# Patient Record
Sex: Male | Born: 1975 | Race: White | Hispanic: No | Smoking: Current every day smoker
Health system: Southern US, Community
[De-identification: ages and names within clinical notes are randomized; demographics above are authoritative.]

## PROBLEM LIST (undated history)

## (undated) DIAGNOSIS — K8020 Calculus of gallbladder without cholecystitis without obstruction: Secondary | ICD-10-CM

## (undated) DIAGNOSIS — R51 Headache: Secondary | ICD-10-CM

## (undated) DIAGNOSIS — G4700 Insomnia, unspecified: Secondary | ICD-10-CM

## (undated) DIAGNOSIS — K219 Gastro-esophageal reflux disease without esophagitis: Secondary | ICD-10-CM

## (undated) HISTORY — PX: APPENDECTOMY: SHX54

---

## 2006-07-17 ENCOUNTER — Emergency Department (HOSPITAL_COMMUNITY): Admission: EM | Admit: 2006-07-17 | Discharge: 2006-07-17 | Payer: Self-pay | Admitting: Emergency Medicine

## 2009-01-12 ENCOUNTER — Emergency Department (HOSPITAL_COMMUNITY): Admission: EM | Admit: 2009-01-12 | Discharge: 2009-01-13 | Payer: Self-pay | Admitting: Emergency Medicine

## 2011-07-02 ENCOUNTER — Emergency Department (HOSPITAL_COMMUNITY): Payer: 59

## 2011-07-02 ENCOUNTER — Encounter (HOSPITAL_COMMUNITY): Payer: Self-pay | Admitting: Emergency Medicine

## 2011-07-02 ENCOUNTER — Emergency Department (HOSPITAL_COMMUNITY)
Admission: EM | Admit: 2011-07-02 | Discharge: 2011-07-02 | Disposition: A | Discharge status: Home / Self Care | Payer: 59 | Attending: Emergency Medicine | Admitting: Emergency Medicine

## 2011-07-02 DIAGNOSIS — G8929 Other chronic pain: Secondary | ICD-10-CM | POA: Insufficient documentation

## 2011-07-02 DIAGNOSIS — F17200 Nicotine dependence, unspecified, uncomplicated: Secondary | ICD-10-CM | POA: Insufficient documentation

## 2011-07-02 DIAGNOSIS — R079 Chest pain, unspecified: Secondary | ICD-10-CM | POA: Insufficient documentation

## 2011-07-02 DIAGNOSIS — M545 Low back pain, unspecified: Secondary | ICD-10-CM | POA: Insufficient documentation

## 2011-07-02 DIAGNOSIS — M549 Dorsalgia, unspecified: Secondary | ICD-10-CM | POA: Insufficient documentation

## 2011-07-02 DIAGNOSIS — Z79899 Other long term (current) drug therapy: Secondary | ICD-10-CM | POA: Insufficient documentation

## 2011-07-02 MED ORDER — HYDROCODONE-ACETAMINOPHEN 5-325 MG PO TABS: 2.0000 | ORAL_TABLET | ORAL | Status: AC | PRN

## 2011-07-02 MED ORDER — HYDROCODONE-ACETAMINOPHEN 5-325 MG PO TABS
2.0000 | ORAL_TABLET | Freq: Once | ORAL | Status: AC
  Administered 2011-07-02: 2 via ORAL
  Filled 2011-07-02: qty 2

## 2011-07-02 NOTE — Discharge Instructions (Signed)
Back Pain, Adult Low back pain is very common. About 1 in 5 people have back pain.The cause of low back pain is rarely dangerous. The pain often gets better over time.About half of people with a sudden onset of back pain feel better in just 2 weeks. About 8 in 10 people feel better by 6 weeks.  CAUSES Some common causes of back pain include:  Strain of the muscles or ligaments supporting the spine.   Wear and tear (degeneration) of the spinal discs.   Arthritis.   Direct injury to the back.  DIAGNOSIS Most of the time, the direct cause of low back pain is not known.However, back pain can be treated effectively even when the exact cause of the pain is unknown.Answering your caregiver's questions about your overall health and symptoms is one of the most accurate ways to make sure the cause of your pain is not dangerous. If your caregiver needs more information, he or she may order lab work or imaging tests (X-rays or MRIs).However, even if imaging tests show changes in your back, this usually does not require surgery. HOME CARE INSTRUCTIONS For many people, back pain returns.Since low back pain is rarely dangerous, it is often a condition that people can learn to manageon their own.   Remain active. It is stressful on the back to sit or stand in one place. Do not sit, drive, or stand in one place for more than 30 minutes at a time. Take short walks on level surfaces as soon as pain allows.Try to increase the length of time you walk each day.   Do not stay in bed.Resting more than 1 or 2 days can delay your recovery.   Do not avoid exercise or work.Your body is made to move.It is not dangerous to be active, even though your back may hurt.Your back will likely heal faster if you return to being active before your pain is gone.   Pay attention to your body when you bend and lift. Many people have less discomfortwhen lifting if they bend their knees, keep the load close to their  bodies,and avoid twisting. Often, the most comfortable positions are those that put less stress on your recovering back.   Find a comfortable position to sleep. Use a firm mattress and lie on your side with your knees slightly bent. If you lie on your back, put a pillow under your knees.   Only take over-the-counter or prescription medicines as directed by your caregiver. Over-the-counter medicines to reduce pain and inflammation are often the most helpful.Your caregiver may prescribe muscle relaxant drugs.These medicines help dull your pain so you can more quickly return to your normal activities and healthy exercise.   Put ice on the injured area.   Put ice in a plastic bag.   Place a towel between your skin and the bag.   Leave the ice on for 15 to 20 minutes, 3 to 4 times a day for the first 2 to 3 days. After that, ice and heat may be alternated to reduce pain and spasms.   Ask your caregiver about trying back exercises and gentle massage. This may be of some benefit.   Avoid feeling anxious or stressed.Stress increases muscle tension and can worsen back pain.It is important to recognize when you are anxious or stressed and learn ways to manage it.Exercise is a great option.  SEEK MEDICAL CARE IF:  You have pain that is not relieved with rest or medicine.   You have   pain that does not improve in 1 week.   You have new symptoms.   You are generally not feeling well.  SEEK IMMEDIATE MEDICAL CARE IF:   You have pain that radiates from your back into your legs.   You develop new bowel or bladder control problems.   You have unusual weakness or numbness in your arms or legs.   You develop nausea or vomiting.   You develop abdominal pain.   You feel faint.  Document Released: 04/28/2005 Document Revised: 01/08/2011 Document Reviewed: 09/16/2010 [facility] Patient Information 2012 [place], [place].  Call Dr. Modesto Charon today to let him noted that you were seen in the emergency  department. He may want to see you in the office Call [facility] today to get a primary care doctor

## 2011-07-02 NOTE — ED Notes (Signed)
Pt alert, nad, c/o mid back pain, onset  A few days ago, pt ambulates to room, steady gait noted, denies recent trauma or injury, seen previously with same c/o

## 2011-07-02 NOTE — ED Provider Notes (Addendum)
History     CSN: [id number]  Arrival date & time 07/02/11  0706   First MD Initiated Contact with Patient 07/02/11 [phone]      Chief Complaint  Patient presents with  . Back Pain    (Consider location/radiation/quality/duration/timing/severity/associated sxs/prior treatment) HPI Complains of upper back pain , pain is located immediately inferior to both scapulae onset last night no trauma no fever worse with movement or changing positions no shortness of breath no other complaint improved with remaining still pain is severe patient suffers from chronic back pain. Treated himself with hydrocodone-A. Pap this morning and ice with partial relief. Use his last 2 pain pills this morning. No other associated symptoms History reviewed. No pertinent past medical history. Chronic back pain Past Surgical History  Procedure Date  . Appendectomy     No family history on file.  History  Substance Use Topics  . Smoking status: Current Everyday Smoker -- 1.0 packs/day    Types: Cigarettes  . Smokeless tobacco: Not on file  . Alcohol Use: No      Review of Systems  Constitutional: Negative.   HENT: Negative.   Respiratory: Negative.   Cardiovascular: Negative.   Gastrointestinal: Negative.   Musculoskeletal: Positive for back pain.  Skin: Negative.   Neurological: Negative.   Hematological: Negative.   Psychiatric/Behavioral: Negative.     Allergies  Review of patient's allergies indicates no known allergies.  Home Medications   Current Outpatient Rx  Name Route Sig Dispense Refill  . HYDROCODONE-ACETAMINOPHEN 7.5-500 MG PO TABS Oral Take 1 tablet by mouth every 6 (six) hours as needed.      BP 116/65  Pulse 101  Temp(Src) 98.7 F (37.1 C) (Oral)  Resp 18  Wt 185 lb (83.915 kg)  SpO2 98%  Physical Exam  Nursing note and vitals reviewed. Constitutional: He appears well-developed and well-nourished. He appears distressed.       Appears uncomfortable  HENT:  Head:  Normocephalic and atraumatic.  Eyes: Conjunctivae are normal. Pupils are equal, round, and reactive to light.  Neck: Neck supple. No tracheal deviation present. No thyromegaly present.  Cardiovascular: Normal rate and regular rhythm.   No murmur heard. Pulmonary/Chest: Effort normal and breath sounds normal.  Abdominal: Soft. Bowel sounds are normal. He exhibits no distension. There is no tenderness.  Musculoskeletal: Normal range of motion. He exhibits no edema and no tenderness.       Entire spine nontender, pain is exacerbated when patient changes position in bed  Neurological: He is alert. Coordination normal.       Gait normal  Skin: Skin is warm and dry. No rash noted.  Psychiatric: He has a normal mood and affect.    ED Course  Procedures (including critical care time)  Labs Reviewed - No data to display No results found.   No diagnosis found. 9:30 PM pain improved after treatment with hydrocodone-A. Pap patient now reports pain is at lower back pain between scapulae has resolved   MDM  Pain felt to be musculoskeletal in etiology Plan prescription hydrocodone-A. Pap Followup Dr. Modesto Charon Patient encouraged to get a primary care Dr. Ardelle Lesches [facility] Diagnosis chronic back pain        Doug Sou, MD 07/02/11 5621  Doug Sou, MD 07/02/11 [phone]

## 2011-07-11 ENCOUNTER — Other Ambulatory Visit: Payer: Self-pay | Admitting: Orthopedic Surgery

## 2011-07-15 ENCOUNTER — Encounter (HOSPITAL_COMMUNITY): Payer: Self-pay | Admitting: Pharmacy Technician

## 2011-07-17 NOTE — Pre-Procedure Instructions (Signed)
21 Ian Burton  07/17/2011   Your procedure is scheduled on:  Thursday, March 14  Report to [facility] Short Stay Center at 10:00 AM.  Call this number if you have problems the morning of surgery: [phone]   Remember:   Do not eat food:After Midnight.  May have clear liquids: up to 4 Hours before arrival.  Clear liquids include soda, tea, black coffee, apple or grape juice, broth.  Take these medicines the morning of surgery with A SIP OF WATER: Hydrocodone   Do not wear jewelry, make-up or nail polish.  Do not wear lotions, powders, or perfumes. You may wear deodorant.  Do not shave 48 hours prior to surgery.  Do not bring valuables to the hospital.  Contacts, dentures or bridgework may not be worn into surgery.  Leave suitcase in the car. After surgery it may be brought to your room.  For patients admitted to the hospital, checkout time is 11:00 AM the day of discharge.   Patients discharged the day of surgery will not be allowed to drive home.  Name and phone number of your driver: Ian Burton [phone]  Special Instructions: Incentive Spirometry - Practice and bring it with you on the day of surgery. and CHG Shower Use Special Wash: 1/2 bottle night before surgery and 1/2 bottle morning of surgery.   Please read over the following fact sheets that you were given: Pain Booklet, Coughing and Deep Breathing, Blood Transfusion Information and Surgical Site Infection Prevention

## 2011-07-18 ENCOUNTER — Encounter (HOSPITAL_COMMUNITY): Payer: Self-pay

## 2011-07-18 ENCOUNTER — Other Ambulatory Visit: Payer: Self-pay

## 2011-07-18 ENCOUNTER — Encounter (HOSPITAL_COMMUNITY)
Admission: RE | Admit: 2011-07-18 | Discharge: 2011-07-18 | Disposition: A | Discharge status: Home / Self Care | Payer: 59 | Source: Ambulatory Visit | Attending: Orthopedic Surgery | Admitting: Orthopedic Surgery

## 2011-07-18 HISTORY — DX: Insomnia, unspecified: G47.00

## 2011-07-18 HISTORY — DX: Headache: R51

## 2011-07-18 HISTORY — DX: Gastro-esophageal reflux disease without esophagitis: K21.9

## 2011-07-18 HISTORY — DX: Calculus of gallbladder without cholecystitis without obstruction: K80.20

## 2011-07-18 LAB — URINALYSIS, ROUTINE W REFLEX MICROSCOPIC
Bilirubin Urine: NEGATIVE
Glucose, UA: NEGATIVE mg/dL
Hgb urine dipstick: NEGATIVE
Protein, ur: NEGATIVE mg/dL
Urobilinogen, UA: 0.2 mg/dL (ref 0.0–1.0)

## 2011-07-18 LAB — TYPE AND SCREEN
ABO/RH(D): A POS
Antibody Screen: NEGATIVE

## 2011-07-18 LAB — COMPREHENSIVE METABOLIC PANEL
BUN: 14 mg/dL (ref 6–23)
CO2: 26 mEq/L (ref 19–32)
Calcium: 9.8 mg/dL (ref 8.4–10.5)
Chloride: 106 mEq/L (ref 96–112)
Creatinine, Ser: 0.79 mg/dL (ref 0.50–1.35)
GFR calc Af Amer: >90 mL/min (ref >90)
GFR calc non Af Amer: >90 mL/min (ref >90)
Glucose, Bld: 97 mg/dL (ref 70–99)
Total Bilirubin: 0.4 mg/dL (ref 0.3–1.2)

## 2011-07-18 LAB — CBC
HCT: 43.1 % (ref 39.0–52.0)
Hemoglobin: 15.4 g/dL (ref 13.0–17.0)
MCH: 30.3 pg (ref 26.0–34.0)
MCV: 84.7 fL (ref 78.0–100.0)
Platelets: 280 10*3/uL (ref 150–400)
RBC: 5.09 MIL/uL (ref 4.22–5.81)
WBC: 7.5 10*3/uL (ref 4.0–10.5)

## 2011-07-18 LAB — SURGICAL PCR SCREEN
MRSA, PCR: NEGATIVE
Staphylococcus aureus: NEGATIVE

## 2011-07-18 LAB — DIFFERENTIAL
Basophils Absolute: 0 10*3/uL (ref 0.0–0.1)
Basophils Relative: 0 % (ref 0–1)
Lymphocytes Relative: 31 % (ref 12–46)
Monocytes Absolute: 0.5 10*3/uL (ref 0.1–1.0)
Neutro Abs: 4.6 10*3/uL (ref 1.7–7.7)
Neutrophils Relative %: 61 % (ref 43–77)

## 2011-07-18 LAB — PROTIME-INR: Prothrombin Time: 13.5 seconds (ref 11.6–15.2)

## 2011-07-21 NOTE — Consult Note (Signed)
Anesthesia:  Patient is a 36 year old male scheduled for a left L2-3 microdiskectomy on 07/24/11.  History includes smoking, insomnia, headaches, GERD, gallstones, and history of appendectomy.    Preoperative labs WNL.  CXR from 07/02/11 showed no acute process.  EKG from 07/18/11 showed SR with sinus arrhythmia, incomplete right BBB.  Currently there are no comparison EKGs.  No CV symptoms reported at PAT and there is no documented history of CAD/MI/CHF or DM.  If no new CV symptoms, then plan to proceed.

## 2011-07-23 MED ORDER — CEFAZOLIN SODIUM-DEXTROSE 2-3 GM-% IV SOLR
2.0000 g | INTRAVENOUS | Status: AC
  Administered 2011-07-24: 2 g via INTRAVENOUS
  Filled 2011-07-23: qty 50

## 2011-07-23 MED ORDER — POVIDONE-IODINE 7.5 % EX SOLN
Freq: Once | CUTANEOUS | Status: DC
  Filled 2011-07-23: qty 118

## 2011-07-23 NOTE — Progress Notes (Signed)
Pt informed of surgical time change and to arrive in short stay at 0900

## 2011-07-24 ENCOUNTER — Encounter (HOSPITAL_COMMUNITY): Payer: Self-pay | Admitting: Vascular Surgery

## 2011-07-24 ENCOUNTER — Ambulatory Visit (HOSPITAL_COMMUNITY): Payer: 59 | Admitting: Vascular Surgery

## 2011-07-24 ENCOUNTER — Ambulatory Visit (HOSPITAL_COMMUNITY)
Admission: RE | Admit: 2011-07-24 | Discharge: 2011-07-24 | Disposition: A | Discharge status: Home / Self Care | Payer: 59 | Source: Ambulatory Visit | Attending: Orthopedic Surgery | Admitting: Orthopedic Surgery

## 2011-07-24 ENCOUNTER — Ambulatory Visit (HOSPITAL_COMMUNITY): Payer: 59

## 2011-07-24 ENCOUNTER — Encounter (HOSPITAL_COMMUNITY)
Admission: RE | Disposition: A | Discharge status: Home / Self Care | Payer: Self-pay | Source: Ambulatory Visit | Attending: Orthopedic Surgery

## 2011-07-24 ENCOUNTER — Encounter (HOSPITAL_COMMUNITY): Payer: Self-pay | Admitting: *Deleted

## 2011-07-24 DIAGNOSIS — K8020 Calculus of gallbladder without cholecystitis without obstruction: Secondary | ICD-10-CM | POA: Insufficient documentation

## 2011-07-24 DIAGNOSIS — K219 Gastro-esophageal reflux disease without esophagitis: Secondary | ICD-10-CM | POA: Insufficient documentation

## 2011-07-24 DIAGNOSIS — R51 Headache: Secondary | ICD-10-CM | POA: Insufficient documentation

## 2011-07-24 DIAGNOSIS — M5410 Radiculopathy, site unspecified: Secondary | ICD-10-CM

## 2011-07-24 DIAGNOSIS — Z01810 Encounter for preprocedural cardiovascular examination: Secondary | ICD-10-CM | POA: Insufficient documentation

## 2011-07-24 DIAGNOSIS — Z01812 Encounter for preprocedural laboratory examination: Secondary | ICD-10-CM | POA: Insufficient documentation

## 2011-07-24 DIAGNOSIS — M5126 Other intervertebral disc displacement, lumbar region: Secondary | ICD-10-CM | POA: Insufficient documentation

## 2011-07-24 DIAGNOSIS — G4700 Insomnia, unspecified: Secondary | ICD-10-CM | POA: Insufficient documentation

## 2011-07-24 HISTORY — PX: LUMBAR LAMINECTOMY/DECOMPRESSION MICRODISCECTOMY: SHX5026

## 2011-07-24 SURGERY — LUMBAR LAMINECTOMY/DECOMPRESSION MICRODISCECTOMY
Anesthesia: General | Site: Back | Laterality: Left | Wound class: Clean

## 2011-07-24 MED ORDER — INDIGOTINDISULFONATE SODIUM 8 MG/ML IJ SOLN
INTRAMUSCULAR | Status: DC | PRN
  Administered 2011-07-24: 5 mL

## 2011-07-24 MED ORDER — ROCURONIUM BROMIDE 100 MG/10ML IV SOLN
INTRAVENOUS | Status: DC | PRN
  Administered 2011-07-24: 50 mg via INTRAVENOUS

## 2011-07-24 MED ORDER — FENTANYL CITRATE 0.05 MG/ML IJ SOLN
INTRAMUSCULAR | Status: DC | PRN
  Administered 2011-07-24 (×2): 50 ug via INTRAVENOUS
  Administered 2011-07-24: 200 ug via INTRAVENOUS
  Administered 2011-07-24: 100 ug via INTRAVENOUS
  Administered 2011-07-24: 50 ug via INTRAVENOUS

## 2011-07-24 MED ORDER — PROPOFOL 10 MG/ML IV EMUL
INTRAVENOUS | Status: DC | PRN
  Administered 2011-07-24: 200 mg via INTRAVENOUS

## 2011-07-24 MED ORDER — ACETAMINOPHEN 325 MG PO TABS: 325.0000 mg | ORAL_TABLET | ORAL | Status: DC | PRN

## 2011-07-24 MED ORDER — GLYCOPYRROLATE 0.2 MG/ML IJ SOLN
INTRAMUSCULAR | Status: DC | PRN
  Administered 2011-07-24: .8 mg via INTRAVENOUS

## 2011-07-24 MED ORDER — VECURONIUM BROMIDE 10 MG IV SOLR
INTRAVENOUS | Status: DC | PRN
  Administered 2011-07-24: 1 mg via INTRAVENOUS

## 2011-07-24 MED ORDER — FENTANYL CITRATE 0.05 MG/ML IJ SOLN
25.0000 ug | INTRAMUSCULAR | Status: DC | PRN
  Administered 2011-07-24 (×4): 25 ug via INTRAVENOUS

## 2011-07-24 MED ORDER — KETOROLAC TROMETHAMINE 30 MG/ML IJ SOLN
15.0000 mg | Freq: Once | INTRAMUSCULAR | Status: AC | PRN
  Administered 2011-07-24: 30 mg via INTRAVENOUS

## 2011-07-24 MED ORDER — THROMBIN 20000 UNITS EX KIT
PACK | CUTANEOUS | Status: DC | PRN
  Administered 2011-07-24: 12:00:00 via TOPICAL

## 2011-07-24 MED ORDER — MIDAZOLAM HCL 2 MG/2ML IJ SOLN: 0.5000 mg | Freq: Once | INTRAMUSCULAR | Status: DC | PRN

## 2011-07-24 MED ORDER — NEOSTIGMINE METHYLSULFATE 1 MG/ML IJ SOLN
INTRAMUSCULAR | Status: DC | PRN
  Administered 2011-07-24: 4 mg via INTRAVENOUS

## 2011-07-24 MED ORDER — PROMETHAZINE HCL 25 MG/ML IJ SOLN: 6.2500 mg | INTRAMUSCULAR | Status: DC | PRN

## 2011-07-24 MED ORDER — LACTATED RINGERS IV SOLN
INTRAVENOUS | Status: DC | PRN
  Administered 2011-07-24 (×2): via INTRAVENOUS

## 2011-07-24 MED ORDER — HETASTARCH-ELECTROLYTES 6 % IV SOLN
INTRAVENOUS | Status: DC | PRN
  Administered 2011-07-24: 11:00:00 via INTRAVENOUS

## 2011-07-24 MED ORDER — LIDOCAINE HCL (CARDIAC) 20 MG/ML IV SOLN
INTRAVENOUS | Status: DC | PRN
  Administered 2011-07-24: 50 mg via INTRAVENOUS

## 2011-07-24 MED ORDER — ONDANSETRON HCL 4 MG/2ML IJ SOLN
INTRAMUSCULAR | Status: DC | PRN
  Administered 2011-07-24: 4 mg via INTRAVENOUS

## 2011-07-24 MED ORDER — MEPERIDINE HCL 25 MG/ML IJ SOLN
6.2500 mg | INTRAMUSCULAR | Status: DC | PRN
Start: 2011-07-24 — End: 2011-07-24

## 2011-07-24 MED ORDER — BUPIVACAINE-EPINEPHRINE 0.25% -1:200000 IJ SOLN
INTRAMUSCULAR | Status: DC | PRN
  Administered 2011-07-24: 10 mL

## 2011-07-24 MED ORDER — MIDAZOLAM HCL 5 MG/5ML IJ SOLN
INTRAMUSCULAR | Status: DC | PRN
  Administered 2011-07-24: 2 mg via INTRAVENOUS

## 2011-07-24 MED ORDER — METHYLPREDNISOLONE ACETATE 40 MG/ML IJ SUSP
INTRAMUSCULAR | Status: DC | PRN
  Administered 2011-07-24: 40 mg via INTRA_ARTICULAR

## 2011-07-24 SURGICAL SUPPLY — 61 items
BENZOIN TINCTURE PRP APPL 2/3 (GAUZE/BANDAGES/DRESSINGS) ×2 IMPLANT
BUR ROUND PRECISION 4.0 (BURR) ×2 IMPLANT
CANISTER SUCTION 2500CC (MISCELLANEOUS) ×2 IMPLANT
CLOTH BEACON ORANGE TIMEOUT ST (SAFETY) ×2 IMPLANT
CONT SPEC STER OR (MISCELLANEOUS) ×2 IMPLANT
CORDS BIPOLAR (ELECTRODE) ×2 IMPLANT
COVER SURGICAL LIGHT HANDLE (MISCELLANEOUS) ×2 IMPLANT
DRAIN CHANNEL 10F 3/8 F FF (DRAIN) IMPLANT
DRAPE POUCH INSTRU U-SHP 10X18 (DRAPES) ×4 IMPLANT
DRAPE SURG 17X23 STRL (DRAPES) ×8 IMPLANT
DURAPREP 26ML APPLICATOR (WOUND CARE) ×2 IMPLANT
ELECT BLADE 4.0 EZ CLEAN MEGAD (MISCELLANEOUS)
ELECT BLADE 6.5 EXT (BLADE) ×2 IMPLANT
ELECT CAUTERY BLADE 6.4 (BLADE) ×2 IMPLANT
ELECT REM PT RETURN 9FT ADLT (ELECTROSURGICAL) ×2
ELECTRODE BLDE 4.0 EZ CLN MEGD (MISCELLANEOUS) IMPLANT
ELECTRODE REM PT RTRN 9FT ADLT (ELECTROSURGICAL) ×1 IMPLANT
EVACUATOR SILICONE 100CC (DRAIN) IMPLANT
FILTER STRAW FLUID ASPIR (MISCELLANEOUS) ×2 IMPLANT
GAUZE SPONGE 4X4 16PLY XRAY LF (GAUZE/BANDAGES/DRESSINGS) ×2 IMPLANT
GLOVE BIO SURGEON STRL SZ8 (GLOVE) ×4 IMPLANT
GLOVE BIOGEL PI IND STRL 8 (GLOVE) ×2 IMPLANT
GLOVE BIOGEL PI INDICATOR 8 (GLOVE) ×2
GOWN STRL NON-REIN LRG LVL3 (GOWN DISPOSABLE) ×4 IMPLANT
IV CATH 14GX2 1/4 (CATHETERS) ×2 IMPLANT
KIT BASIN OR (CUSTOM PROCEDURE TRAY) ×2 IMPLANT
KIT ROOM TURNOVER OR (KITS) ×2 IMPLANT
NEEDLE 18GX1X1/2 (RX/OR ONLY) (NEEDLE) ×2 IMPLANT
NEEDLE 22X1 1/2 (OR ONLY) (NEEDLE) ×2 IMPLANT
NEEDLE HYPO 25GX1X1/2 BEV (NEEDLE) ×2 IMPLANT
NEEDLE SPNL 18GX3.5 QUINCKE PK (NEEDLE) ×4 IMPLANT
NS IRRIG 1000ML POUR BTL (IV SOLUTION) ×2 IMPLANT
PACK LAMINECTOMY ORTHO (CUSTOM PROCEDURE TRAY) ×2 IMPLANT
PACK UNIVERSAL I (CUSTOM PROCEDURE TRAY) ×2 IMPLANT
PAD ARMBOARD 7.5X6 YLW CONV (MISCELLANEOUS) ×4 IMPLANT
PATTIES SURGICAL .5 X.5 (GAUZE/BANDAGES/DRESSINGS) IMPLANT
PATTIES SURGICAL .5 X1 (DISPOSABLE) ×2 IMPLANT
PATTIES SURGICAL 1X1 (DISPOSABLE) IMPLANT
SPONGE GAUZE 4X4 12PLY (GAUZE/BANDAGES/DRESSINGS) ×2 IMPLANT
SPONGE INTESTINAL PEANUT (DISPOSABLE) ×2 IMPLANT
SPONGE SURGIFOAM ABS GEL 100 (HEMOSTASIS) ×2 IMPLANT
STRIP CLOSURE SKIN 1/2X4 (GAUZE/BANDAGES/DRESSINGS) ×2 IMPLANT
SURGIFLO TRUKIT (HEMOSTASIS) ×2 IMPLANT
SUT ETHILON 3 0 FSL (SUTURE) IMPLANT
SUT MNCRL AB 4-0 PS2 18 (SUTURE) ×2 IMPLANT
SUT VIC AB 0 CT1 27 (SUTURE)
SUT VIC AB 0 CT1 27XBRD ANBCTR (SUTURE) IMPLANT
SUT VIC AB 0 CT2 27 (SUTURE) ×2 IMPLANT
SUT VIC AB 1 CT1 18XCR BRD 8 (SUTURE) ×1 IMPLANT
SUT VIC AB 1 CT1 8-18 (SUTURE) ×1
SUT VIC AB 2-0 CT2 18 VCP726D (SUTURE) ×2 IMPLANT
SYR 20CC LL (SYRINGE) IMPLANT
SYR BULB IRRIGATION 50ML (SYRINGE) ×2 IMPLANT
SYR CONTROL 10ML LL (SYRINGE) ×2 IMPLANT
SYR TB 1ML 26GX3/8 SAFETY (SYRINGE) ×4 IMPLANT
SYR TB 1ML LUER SLIP (SYRINGE) ×4 IMPLANT
TAPE CLOTH SURG 4X10 WHT LF (GAUZE/BANDAGES/DRESSINGS) ×2 IMPLANT
TOWEL OR 17X24 6PK STRL BLUE (TOWEL DISPOSABLE) ×2 IMPLANT
TOWEL OR 17X26 10 PK STRL BLUE (TOWEL DISPOSABLE) ×2 IMPLANT
WATER STERILE IRR 1000ML POUR (IV SOLUTION) IMPLANT
YANKAUER SUCT BULB TIP NO VENT (SUCTIONS) ×2 IMPLANT

## 2011-07-24 NOTE — Anesthesia Preprocedure Evaluation (Signed)
Anesthesia Evaluation  Patient identified by MRN, date of birth, ID band Patient awake    Reviewed: Allergy & Precautions, H&P , NPO status , Patient's Chart, lab work & pertinent test results, reviewed documented beta blocker date and time   Airway Mallampati: I      Dental  (+) Teeth Intact and Dental Advisory Given   Pulmonary    Pulmonary exam normal       Cardiovascular     Neuro/Psych  Headaches,    GI/Hepatic GERD-  Controlled,  Endo/Other    Renal/GU      Musculoskeletal   Abdominal Normal abdominal exam  (+)   Peds  Hematology   Anesthesia Other Findings   Reproductive/Obstetrics                           Anesthesia Physical Anesthesia Plan  ASA: I  Anesthesia Plan: General   Post-op Pain Management:    Induction: Intravenous  Airway Management Planned: Oral ETT  Additional Equipment:   Intra-op Plan:   Post-operative Plan: Extubation in OR  Informed Consent:   Dental advisory given  Plan Discussed with: Anesthesiologist  Anesthesia Plan Comments:         Anesthesia Quick Evaluation

## 2011-07-24 NOTE — Transfer of Care (Signed)
Immediate Anesthesia Transfer of Care Note  Patient: Ian Burton  Procedure(s) Performed: Procedure(s) (LRB): LUMBAR LAMINECTOMY/DECOMPRESSION MICRODISCECTOMY (Left)  Patient Location: PACU  Anesthesia Type: General  Level of Consciousness: awake, alert  and oriented  Airway & Oxygen Therapy: Patient Spontanous Breathing and Patient connected to nasal cannula oxygen  Post-op Assessment: Report given to PACU RN and Post -op Vital signs reviewed and stable  Post vital signs: Reviewed  Complications: No apparent anesthesia complications

## 2011-07-24 NOTE — Anesthesia Procedure Notes (Signed)
Procedure Name: Intubation Date/Time: 07/24/2011 10:41 AM Performed by: Mancel Parsons Pre-anesthesia Checklist: Patient identified, Timeout performed, Emergency Drugs available, Suction available and Patient being monitored Oxygen Delivery Method: Circle system utilized Preoxygenation: Pre-oxygenation with 100% oxygen Intubation Type: IV induction Ventilation: Mask ventilation without difficulty and Oral airway inserted - appropriate to patient size Laryngoscope Size: Mac and 3 Grade View: Grade II Tube type: Oral Tube size: 8.0 mm Number of attempts: 1 Airway Equipment and Method: Stylet Placement Confirmation: ETT inserted through vocal cords under direct vision,  positive ETCO2 and breath sounds checked- equal and bilateral Secured at: 22 cm Tube secured with: Tape Dental Injury: Teeth and Oropharynx as per pre-operative assessment

## 2011-07-24 NOTE — H&P (Signed)
PREOPERATIVE H&P  Chief Complaint: leg pain  HPI: Ian Burton is a 36 y.o. male who presents with leg pain  Past Medical History  Diagnosis Date  . Insomnia   . Gallstones     hx of, never removed  . Headache     migraines  . GERD (gastroesophageal reflux disease)    Past Surgical History  Procedure Date  . Appendectomy    History   Social History  . Marital Status: Married    Spouse Name: N/A    Number of Children: N/A  . Years of Education: N/A   Social History Main Topics  . Smoking status: Current Everyday Smoker -- 1.0 packs/day for 15 years    Types: Cigarettes  . Smokeless tobacco: Not on file  . Alcohol Use: No  . Drug Use: No  . Sexually Active: Yes   Other Topics Concern  . Not on file   Social History Narrative  . No narrative on file   No family history on file. No Known Allergies Prior to Admission medications   Medication Sig Start Date End Date Taking? Authorizing Provider  HYDROcodone-acetaminophen (NORCO) 7.5-325 MG per tablet Take 1 tablet by mouth every 8 (eight) hours as needed. For pain   Yes Historical Provider, MD     All other systems have been reviewed and were otherwise negative with the exception of those mentioned in the HPI and as above.  Physical Exam: There were no vitals filed for this visit.  General: Alert, no acute distress Cardiovascular: No pedal edema Respiratory: No cyanosis, no use of accessory musculature GI: No organomegaly, abdomen is soft and non-tender Skin: No lesions in the area of chief complaint Neurologic: Sensation intact distally Psychiatric: Patient is competent for consent with normal mood and affect Lymphatic: No axillary or cervical lymphadenopathy  MUSCULOSKELETAL: + TTP low back  Assessment/Plan: Leg pain Plan for Procedure(s): LUMBAR LAMINECTOMY/DECOMPRESSION MICRODISCECTOMY   Emilee Hero, MD 07/24/2011 6:11 AM

## 2011-07-24 NOTE — Preoperative (Signed)
Beta Blockers   Reason not to administer Beta Blockers:Not Applicable. No home beta blockers 

## 2011-07-24 NOTE — Anesthesia Postprocedure Evaluation (Signed)
Anesthesia Post Note  Patient: Ian Burton  Procedure(s) Performed: Procedure(s) (LRB): LUMBAR LAMINECTOMY/DECOMPRESSION MICRODISCECTOMY (Left)  Anesthesia type: GA  Patient location: PACU  Post pain: Pain level controlled  Post assessment: Post-op Vital signs reviewed  Last Vitals:  Filed Vitals:   07/24/11 0840  BP: 116/75  Pulse: 73  Temp: 36.8 C  Resp: 18    Post vital signs: Reviewed  Level of consciousness: sedated  Complications: No apparent anesthesia complications

## 2011-07-25 MED FILL — Ketorolac Tromethamine Inj 30 MG/ML: INTRAMUSCULAR | Qty: 1 | Status: AC

## 2011-07-25 NOTE — Op Note (Signed)
NAME:  Burton, MACLAREN NO.:  [id number]  MEDICAL RECORD NO.:  [id number]  LOCATION:  [facility]                         FACILITY:  [facility]  PHYSICIAN:  Estill Bamberg, MD      DATE OF BIRTH:  07-Feb-1976  DATE OF PROCEDURE:  07/24/2011 DATE OF DISCHARGE:  07/24/2011                              OPERATIVE REPORT   PREOPERATIVE DIAGNOSIS:  Left-sided L3 radiculopathy secondary to large extruded left-sided L2-3 disk fragment.  POSTOPERATIVE DIAGNOSIS:  Left-sided L3 radiculopathy secondary to large extruded left-sided L2-3 disk fragment.  PROCEDURES:  Left-sided L2-3 foraminotomy and partial facetectomy with removal of large extruded L2-3 disk fragment.  SURGEON:  Estill Bamberg, MD  ASSISTANT:  Janace Litten, OPA.  ANESTHESIA:  General endotracheal anesthesia.  COMPLICATIONS:  None.  DISPOSITION:  Stable.  ESTIMATED BLOOD LOSS:  Minimal.  INDICATIONS FOR PROCEDURE:  Briefly, Mr. Ian Burton is a very pleasant 36- year-old male who initially presented to me with severe pain, numbness, and weakness in the left leg.  An MRI was clearly notable for a large extruded L2-3 disk herniation with a caudad migration behind the L3 vertebral body.  Given the patient's ongoing weakness and failure of conservative care, we did have a discussion regarding going forward with removal of the disk fragment.  The patient fully understood the risks and limitations of the procedure as outlined in my preoperative  note.  OPERATIVE DETAILS:  On July 24, 2011, the patient brought to the surgery and general endotracheal anesthesia was administered.  The patient was placed prone on a well-padded Jackson bed with a Wilson frame.  Antibiotics were given, and SCDs were placed.  Time-out procedure was performed.  I then obtained a single lateral intraoperative radiograph with two 18-gauge spinal needles over both the L2 and the L3 spinous processes.  Indigo carmine was injected in the needle over  the L2 spinous process which did help confirm the appropriate operative level.  I then made a 1-inch incision centered over the L2-3 disk space.  A curvilinear incision was made in the fascia on the left side and the paraspinal musculature was bluntly swept laterally.  A self-retaining McCulloch retractor was then placed.  I then used a 4-mm high-speed bur to perform a laminotomy at the inferior medial aspect of the L2 lamina.  The ligamentum flavum was readily identified and removed meticulously.  The traversing L3 nerve was readily identified and clearly noted to be under severe for compression. I did gently medialize the nerve, and there was a large L2-3 disk fragment which was clearly notable to be located behind the L3 vertebral body and at the level of the L2-3 disk.  I did use a Penfield 4 to tease away all the adhesions surrounding the disk, and I was ultimately able to free up the disk herniation and removed 1 large disk fragment which did include both the intervertebral area and the area behind the L3 vertebral body.  At this point, I did use FloSeal for approximately 5 minutes and all epidural bleeding was controlled in this manner.  The wound was then copiously irrigated using 500 mL of normal saline.  The fascia was then closed using 1  Vicryl and the subcutaneous layer was closed using 2-0 Vicryl and the skin was closed using 4-0 Monocryl.  All instrument counts were correct at the termination of the procedure.  The patient was then awakened from general endotracheal anesthesia and transferred to recovery in stable condition.  Of note, Janace Litten, was my assistant throughout the procedure and aided in essential retraction and suctioning required throughout the surgery.     Estill Bamberg, MD     MD/MEDQ  D:  07/24/2011  T:  07/25/2011  Job:  [id number]

## 2011-07-29 ENCOUNTER — Encounter (HOSPITAL_COMMUNITY): Payer: Self-pay | Admitting: Orthopedic Surgery

## 2012-01-16 ENCOUNTER — Ambulatory Visit
Admission: RE | Admit: 2012-01-16 | Discharge: 2012-01-16 | Disposition: A | Discharge status: Home / Self Care | Payer: 59 | Source: Ambulatory Visit | Attending: Orthopedic Surgery | Admitting: Orthopedic Surgery

## 2012-01-16 ENCOUNTER — Other Ambulatory Visit: Payer: Self-pay | Admitting: Orthopedic Surgery

## 2012-01-16 DIAGNOSIS — M542 Cervicalgia: Secondary | ICD-10-CM

## 2013-11-21 ENCOUNTER — Other Ambulatory Visit: Payer: Self-pay | Admitting: Orthopedic Surgery

## 2013-11-23 ENCOUNTER — Encounter (HOSPITAL_COMMUNITY): Payer: Self-pay | Admitting: Pharmacy Technician

## 2013-11-25 ENCOUNTER — Inpatient Hospital Stay (HOSPITAL_COMMUNITY)
Admission: RE | Admit: 2013-11-25 | Discharge: 2013-11-25 | Disposition: A | Discharge status: Home / Self Care | Payer: 59 | Source: Ambulatory Visit

## 2013-11-25 NOTE — Pre-Procedure Instructions (Signed)
Ian PeonWilliam T Burton  11/25/2013   Your procedure is scheduled on:  Thursday, July 23.  [facility].  Call this number if you have problems the morning of surgery: [phone]   Remember:   Do not eat food or drink liquids after midnight Wednesday, July 22.  Take these medicines the morning of surgery with A SIP OF WATER: take if needed HYDROcodone-acetaminophen (NORCO).    Do not wear jewelry, make-up or nail polish.  Do not wear lotions, powders, or perfumes.              Men may shave face and neck.  Do not bring valuables to the hospital.            [facility] not [facility] any belongings or valuables.               Contacts, dentures or bridgework may not be worn into surgery.  Leave suitcase in the car. After surgery it may be brought to your room.  For patients admitted to the hospital, discharge time is determined by your treatment team.               Patients discharged the day of surgery will not be allowed to drive home.  Name and phone number of your driver: -   Special Instructions: -   Please read over the following fact sheets that you were given: Pain Booklet, Coughing and Deep Breathing, Blood Transfusion Information and Surgical Site Infection Prevention And Incentive Spirometery

## 2013-11-29 ENCOUNTER — Encounter (HOSPITAL_COMMUNITY): Payer: Self-pay | Admitting: *Deleted

## 2013-11-29 MED ORDER — CEFAZOLIN SODIUM-DEXTROSE 2-3 GM-% IV SOLR
2.0000 g | INTRAVENOUS | Status: AC
  Administered 2013-11-30: 2 g via INTRAVENOUS
  Filled 2013-11-29: qty 50

## 2013-11-29 NOTE — Progress Notes (Signed)
Pt denies SOB, chest pain, and being under the care of a cardiologist. Pt denies having a chest x ray and EKG within the last year. Pt denies having a stress test, echo, and cardiac cath. Pt advised to stop taking Aspirin,vitamins, and herbal medications. Do not take any NSAIDs ie: Ibuprofen, Advil, Naproxen or any medication containing Aspirin.

## 2013-11-30 ENCOUNTER — Encounter (HOSPITAL_COMMUNITY)
Admission: RE | Disposition: A | Discharge status: Home / Self Care | Payer: Self-pay | Source: Ambulatory Visit | Attending: Orthopedic Surgery

## 2013-11-30 ENCOUNTER — Encounter (HOSPITAL_COMMUNITY): Payer: 59 | Admitting: Anesthesiology

## 2013-11-30 ENCOUNTER — Ambulatory Visit (HOSPITAL_COMMUNITY): Payer: 59

## 2013-11-30 ENCOUNTER — Observation Stay (HOSPITAL_COMMUNITY)
Admission: RE | Admit: 2013-11-30 | Discharge: 2013-12-01 | Disposition: A | Discharge status: Home / Self Care | Payer: 59 | Source: Ambulatory Visit | Attending: Orthopedic Surgery | Admitting: Orthopedic Surgery

## 2013-11-30 ENCOUNTER — Encounter (HOSPITAL_COMMUNITY): Payer: Self-pay

## 2013-11-30 ENCOUNTER — Other Ambulatory Visit (HOSPITAL_COMMUNITY): Payer: 59

## 2013-11-30 ENCOUNTER — Ambulatory Visit (HOSPITAL_COMMUNITY): Payer: 59 | Admitting: Anesthesiology

## 2013-11-30 DIAGNOSIS — F1210 Cannabis abuse, uncomplicated: Secondary | ICD-10-CM | POA: Insufficient documentation

## 2013-11-30 DIAGNOSIS — M4802 Spinal stenosis, cervical region: Principal | ICD-10-CM | POA: Insufficient documentation

## 2013-11-30 DIAGNOSIS — M5410 Radiculopathy, site unspecified: Secondary | ICD-10-CM | POA: Diagnosis present

## 2013-11-30 DIAGNOSIS — R51 Headache: Secondary | ICD-10-CM | POA: Insufficient documentation

## 2013-11-30 DIAGNOSIS — F17200 Nicotine dependence, unspecified, uncomplicated: Secondary | ICD-10-CM | POA: Insufficient documentation

## 2013-11-30 DIAGNOSIS — M5412 Radiculopathy, cervical region: Secondary | ICD-10-CM | POA: Insufficient documentation

## 2013-11-30 DIAGNOSIS — K219 Gastro-esophageal reflux disease without esophagitis: Secondary | ICD-10-CM | POA: Insufficient documentation

## 2013-11-30 HISTORY — PX: ANTERIOR CERVICAL DECOMP/DISCECTOMY FUSION: SHX1161

## 2013-11-30 LAB — CBC WITH DIFFERENTIAL/PLATELET
BASOS ABS: 0 10*3/uL (ref 0.0–0.1)
BASOS PCT: 0 % (ref 0–1)
Eosinophils Absolute: 0 10*3/uL (ref 0.0–0.7)
Eosinophils Relative: 1 % (ref 0–5)
HCT: 45.5 % (ref 39.0–52.0)
Hemoglobin: 15.8 g/dL (ref 13.0–17.0)
Lymphocytes Relative: 13 % (ref 12–46)
Lymphs Abs: 1 10*3/uL (ref 0.7–4.0)
MCH: 30.3 pg (ref 26.0–34.0)
MCHC: 34.7 g/dL (ref 30.0–36.0)
MCV: 87.2 fL (ref 78.0–100.0)
Monocytes Absolute: 0.5 10*3/uL (ref 0.1–1.0)
Monocytes Relative: 7 % (ref 3–12)
NEUTROS ABS: 6.3 10*3/uL (ref 1.7–7.7)
NEUTROS PCT: 79 % — AB (ref 43–77)
PLATELETS: 204 10*3/uL (ref 150–400)
RBC: 5.22 MIL/uL (ref 4.22–5.81)
RDW: 12.4 % (ref 11.5–15.5)
WBC: 7.9 10*3/uL (ref 4.0–10.5)

## 2013-11-30 LAB — COMPREHENSIVE METABOLIC PANEL
ALBUMIN: 4 g/dL (ref 3.5–5.2)
ALT: 13 U/L (ref 0–53)
AST: 13 U/L (ref 0–37)
Alkaline Phosphatase: 56 U/L (ref 39–117)
Anion gap: 16 — ABNORMAL HIGH (ref 5–15)
BILIRUBIN TOTAL: 0.5 mg/dL (ref 0.3–1.2)
BUN: 14 mg/dL (ref 6–23)
CHLORIDE: 97 meq/L (ref 96–112)
CO2: 24 mEq/L (ref 19–32)
Calcium: 9.4 mg/dL (ref 8.4–10.5)
Creatinine, Ser: 0.88 mg/dL (ref 0.50–1.35)
GFR calc Af Amer: >90 mL/min (ref >90)
GFR calc non Af Amer: >90 mL/min (ref >90)
Glucose, Bld: 118 mg/dL — ABNORMAL HIGH (ref 70–99)
Potassium: 3.6 mEq/L — ABNORMAL LOW (ref 3.7–5.3)
SODIUM: 137 meq/L (ref 137–147)
Total Protein: 7.4 g/dL (ref 6.0–8.3)

## 2013-11-30 LAB — TYPE AND SCREEN
ABO/RH(D): A POS
ANTIBODY SCREEN: NEGATIVE

## 2013-11-30 LAB — SURGICAL PCR SCREEN
MRSA, PCR: NEGATIVE
STAPHYLOCOCCUS AUREUS: NEGATIVE

## 2013-11-30 LAB — PROTIME-INR
INR: 1 (ref 0.00–1.49)
PROTHROMBIN TIME: 13.2 s (ref 11.6–15.2)

## 2013-11-30 LAB — APTT: APTT: 31 s (ref 24–37)

## 2013-11-30 SURGERY — ANTERIOR CERVICAL DECOMPRESSION/DISCECTOMY FUSION 2 LEVELS
Anesthesia: General | Site: Spine Cervical

## 2013-11-30 MED ORDER — FENTANYL CITRATE 0.05 MG/ML IJ SOLN
INTRAMUSCULAR | Status: AC
  Filled 2013-11-30: qty 5

## 2013-11-30 MED ORDER — ROCURONIUM BROMIDE 50 MG/5ML IV SOLN
INTRAVENOUS | Status: AC
  Filled 2013-11-30: qty 1

## 2013-11-30 MED ORDER — FENTANYL CITRATE 0.05 MG/ML IJ SOLN
INTRAMUSCULAR | Status: DC | PRN
  Administered 2013-11-30 (×4): 50 ug via INTRAVENOUS
  Administered 2013-11-30 (×3): 100 ug via INTRAVENOUS
  Administered 2013-11-30: 50 ug via INTRAVENOUS

## 2013-11-30 MED ORDER — DIAZEPAM 5 MG PO TABS
ORAL_TABLET | ORAL | Status: AC
  Administered 2013-11-30: 5 mg via ORAL
  Filled 2013-11-30: qty 1

## 2013-11-30 MED ORDER — ONDANSETRON HCL 4 MG/2ML IJ SOLN
INTRAMUSCULAR | Status: AC
  Filled 2013-11-30: qty 2

## 2013-11-30 MED ORDER — ONDANSETRON HCL 4 MG/2ML IJ SOLN: 4.0000 mg | INTRAMUSCULAR | Status: DC | PRN

## 2013-11-30 MED ORDER — DOCUSATE SODIUM 100 MG PO CAPS
100.0000 mg | ORAL_CAPSULE | Freq: Two times a day (BID) | ORAL | Status: DC
  Filled 2013-11-30 (×3): qty 1

## 2013-11-30 MED ORDER — CEFAZOLIN SODIUM 1-5 GM-% IV SOLN
1.0000 g | Freq: Three times a day (TID) | INTRAVENOUS | Status: AC
  Administered 2013-11-30 – 2013-12-01 (×2): 1 g via INTRAVENOUS
  Filled 2013-11-30 (×2): qty 50

## 2013-11-30 MED ORDER — BISACODYL 5 MG PO TBEC
5.0000 mg | DELAYED_RELEASE_TABLET | Freq: Every day | ORAL | Status: DC | PRN
  Filled 2013-11-30: qty 1

## 2013-11-30 MED ORDER — LACTATED RINGERS IV SOLN
INTRAVENOUS | Status: DC
  Administered 2013-11-30: 13:00:00 via INTRAVENOUS

## 2013-11-30 MED ORDER — NEOSTIGMINE METHYLSULFATE 10 MG/10ML IV SOLN
INTRAVENOUS | Status: AC
  Filled 2013-11-30: qty 1

## 2013-11-30 MED ORDER — PHENOL 1.4 % MT LIQD: 1.0000 | OROMUCOSAL | Status: DC | PRN

## 2013-11-30 MED ORDER — POVIDONE-IODINE 7.5 % EX SOLN
Freq: Once | CUTANEOUS | Status: DC
  Filled 2013-11-30: qty 118

## 2013-11-30 MED ORDER — LACTATED RINGERS IV SOLN
INTRAVENOUS | Status: DC | PRN
  Administered 2013-11-30 (×3): via INTRAVENOUS

## 2013-11-30 MED ORDER — MUPIROCIN 2 % EX OINT
TOPICAL_OINTMENT | CUTANEOUS | Status: AC
  Filled 2013-11-30: qty 22

## 2013-11-30 MED ORDER — ALUM & MAG HYDROXIDE-SIMETH 200-200-20 MG/5ML PO SUSP: 30.0000 mL | Freq: Four times a day (QID) | ORAL | Status: DC | PRN

## 2013-11-30 MED ORDER — GLYCOPYRROLATE 0.2 MG/ML IJ SOLN
INTRAMUSCULAR | Status: AC
  Filled 2013-11-30: qty 1

## 2013-11-30 MED ORDER — GLYCOPYRROLATE 0.2 MG/ML IJ SOLN
INTRAMUSCULAR | Status: DC | PRN
  Administered 2013-11-30: 0.2 mg via INTRAVENOUS

## 2013-11-30 MED ORDER — HYDROMORPHONE HCL PF 1 MG/ML IJ SOLN
INTRAMUSCULAR | Status: AC
  Administered 2013-11-30: 0.5 mg via INTRAVENOUS
  Filled 2013-11-30: qty 1

## 2013-11-30 MED ORDER — LIDOCAINE HCL (CARDIAC) 20 MG/ML IV SOLN
INTRAVENOUS | Status: DC | PRN
  Administered 2013-11-30: 100 mg via INTRAVENOUS

## 2013-11-30 MED ORDER — PROPOFOL 10 MG/ML IV BOLUS
INTRAVENOUS | Status: DC | PRN
  Administered 2013-11-30: 200 mg via INTRAVENOUS

## 2013-11-30 MED ORDER — ONDANSETRON HCL 4 MG/2ML IJ SOLN: 4.0000 mg | Freq: Four times a day (QID) | INTRAMUSCULAR | Status: DC | PRN

## 2013-11-30 MED ORDER — MUPIROCIN 2 % EX OINT
1.0000 "application " | TOPICAL_OINTMENT | Freq: Once | CUTANEOUS | Status: AC
  Administered 2013-11-30: 1 via TOPICAL

## 2013-11-30 MED ORDER — THROMBIN 20000 UNITS EX SOLR
CUTANEOUS | Status: AC
  Filled 2013-11-30: qty 20000

## 2013-11-30 MED ORDER — PHENYLEPHRINE HCL 10 MG/ML IJ SOLN
INTRAMUSCULAR | Status: DC | PRN
  Administered 2013-11-30: 80 ug via INTRAVENOUS

## 2013-11-30 MED ORDER — THROMBIN 20000 UNITS EX KIT
PACK | CUTANEOUS | Status: AC
  Filled 2013-11-30: qty 1

## 2013-11-30 MED ORDER — MENTHOL 3 MG MT LOZG: 1.0000 | LOZENGE | OROMUCOSAL | Status: DC | PRN

## 2013-11-30 MED ORDER — HYDROMORPHONE HCL PF 1 MG/ML IJ SOLN
0.5000 mg | INTRAMUSCULAR | Status: DC | PRN
  Administered 2013-11-30 (×2): 0.5 mg via INTRAVENOUS

## 2013-11-30 MED ORDER — SENNOSIDES-DOCUSATE SODIUM 8.6-50 MG PO TABS
1.0000 | ORAL_TABLET | Freq: Every evening | ORAL | Status: DC | PRN
  Filled 2013-11-30: qty 1

## 2013-11-30 MED ORDER — BUPIVACAINE-EPINEPHRINE (PF) 0.25% -1:200000 IJ SOLN
INTRAMUSCULAR | Status: AC
  Filled 2013-11-30: qty 30

## 2013-11-30 MED ORDER — 0.9 % SODIUM CHLORIDE (POUR BTL) OPTIME
TOPICAL | Status: DC | PRN
  Administered 2013-11-30: 1000 mL

## 2013-11-30 MED ORDER — MIDAZOLAM HCL 2 MG/2ML IJ SOLN
INTRAMUSCULAR | Status: AC
  Filled 2013-11-30: qty 2

## 2013-11-30 MED ORDER — HYDROMORPHONE HCL PF 1 MG/ML IJ SOLN
0.2500 mg | INTRAMUSCULAR | Status: DC | PRN
  Administered 2013-11-30 (×4): 0.5 mg via INTRAVENOUS

## 2013-11-30 MED ORDER — FLEET ENEMA 7-19 GM/118ML RE ENEM
1.0000 | ENEMA | Freq: Once | RECTAL | Status: AC | PRN
  Filled 2013-11-30: qty 1

## 2013-11-30 MED ORDER — OXYCODONE HCL 5 MG PO TABS: 5.0000 mg | ORAL_TABLET | Freq: Once | ORAL | Status: DC | PRN

## 2013-11-30 MED ORDER — ACETAMINOPHEN 650 MG RE SUPP: 650.0000 mg | RECTAL | Status: DC | PRN

## 2013-11-30 MED ORDER — MIDAZOLAM HCL 2 MG/2ML IJ SOLN
INTRAMUSCULAR | Status: AC
  Administered 2013-11-30: 2 mg via INTRAVENOUS
  Filled 2013-11-30: qty 2

## 2013-11-30 MED ORDER — DIAZEPAM 5 MG PO TABS
5.0000 mg | ORAL_TABLET | Freq: Four times a day (QID) | ORAL | Status: DC | PRN
  Administered 2013-11-30 – 2013-12-01 (×2): 5 mg via ORAL
  Filled 2013-11-30: qty 1

## 2013-11-30 MED ORDER — ROCURONIUM BROMIDE 100 MG/10ML IV SOLN
INTRAVENOUS | Status: DC | PRN
  Administered 2013-11-30: 50 mg via INTRAVENOUS

## 2013-11-30 MED ORDER — THROMBIN 20000 UNITS EX SOLR
CUTANEOUS | Status: DC | PRN
  Administered 2013-11-30: 16:00:00 via TOPICAL

## 2013-11-30 MED ORDER — OXYCODONE-ACETAMINOPHEN 5-325 MG PO TABS
1.0000 | ORAL_TABLET | ORAL | Status: DC | PRN
  Administered 2013-11-30 – 2013-12-01 (×2): 2 via ORAL
  Filled 2013-11-30 (×2): qty 2

## 2013-11-30 MED ORDER — BUPIVACAINE-EPINEPHRINE 0.25% -1:200000 IJ SOLN
INTRAMUSCULAR | Status: DC | PRN
  Administered 2013-11-30: 3 mL

## 2013-11-30 MED ORDER — DEXAMETHASONE SODIUM PHOSPHATE 10 MG/ML IJ SOLN
INTRAMUSCULAR | Status: DC | PRN
  Administered 2013-11-30: 10 mg via INTRAVENOUS

## 2013-11-30 MED ORDER — SODIUM CHLORIDE 0.9 % IJ SOLN
3.0000 mL | INTRAMUSCULAR | Status: DC | PRN
Start: 2013-11-30 — End: 2013-12-01

## 2013-11-30 MED ORDER — LIDOCAINE HCL (CARDIAC) 20 MG/ML IV SOLN
INTRAVENOUS | Status: AC
  Filled 2013-11-30: qty 5

## 2013-11-30 MED ORDER — ACETAMINOPHEN 325 MG PO TABS: 650.0000 mg | ORAL_TABLET | ORAL | Status: DC | PRN

## 2013-11-30 MED ORDER — MIDAZOLAM HCL 2 MG/2ML IJ SOLN
2.0000 mg | Freq: Once | INTRAMUSCULAR | Status: AC
  Administered 2013-11-30: 2 mg via INTRAVENOUS

## 2013-11-30 MED ORDER — SODIUM CHLORIDE 0.9 % IV SOLN: 250.0000 mL | INTRAVENOUS | Status: DC

## 2013-11-30 MED ORDER — NEOSTIGMINE METHYLSULFATE 10 MG/10ML IV SOLN
INTRAVENOUS | Status: DC | PRN
  Administered 2013-11-30: 2 mg via INTRAVENOUS

## 2013-11-30 MED ORDER — GLYCOPYRROLATE 0.2 MG/ML IJ SOLN
INTRAMUSCULAR | Status: AC
  Filled 2013-11-30: qty 2

## 2013-11-30 MED ORDER — MORPHINE SULFATE 2 MG/ML IJ SOLN
1.0000 mg | INTRAMUSCULAR | Status: DC | PRN
  Administered 2013-11-30 – 2013-12-01 (×3): 4 mg via INTRAVENOUS
  Filled 2013-11-30 (×3): qty 2

## 2013-11-30 MED ORDER — SODIUM CHLORIDE 0.9 % IJ SOLN: 3.0000 mL | Freq: Two times a day (BID) | INTRAMUSCULAR | Status: DC

## 2013-11-30 MED ORDER — MIDAZOLAM HCL 5 MG/5ML IJ SOLN
INTRAMUSCULAR | Status: DC | PRN
  Administered 2013-11-30 (×2): 2 mg via INTRAVENOUS

## 2013-11-30 MED ORDER — PROPOFOL 10 MG/ML IV BOLUS
INTRAVENOUS | Status: AC
  Filled 2013-11-30: qty 20

## 2013-11-30 MED ORDER — OXYCODONE HCL 5 MG/5ML PO SOLN: 5.0000 mg | Freq: Once | ORAL | Status: DC | PRN

## 2013-11-30 SURGICAL SUPPLY — 76 items
BENZOIN TINCTURE PRP APPL 2/3 (GAUZE/BANDAGES/DRESSINGS) ×3 IMPLANT
BIT DRILL NEURO 2X3.1 SFT TUCH (MISCELLANEOUS) ×1 IMPLANT
BIT DRILL SRG 14X2.2XFLT CHK (BIT) ×1 IMPLANT
BIT DRL SRG 14X2.2XFLT CHK (BIT) ×1
BLADE SURG 15 STRL LF DISP TIS (BLADE) ×1 IMPLANT
BLADE SURG 15 STRL SS (BLADE) ×2
BLADE SURG ROTATE 9660 (MISCELLANEOUS) ×3 IMPLANT
BUR MATCHSTICK NEURO 3.0 LAGG (BURR) IMPLANT
CARTRIDGE OIL MAESTRO DRILL (MISCELLANEOUS) ×1 IMPLANT
CERVICAL PARALLEL MED 7MM (Bone Implant) ×3 IMPLANT
CLOSURE STERI-STRIP 1/2X4 (GAUZE/BANDAGES/DRESSINGS) ×1
CLOSURE WOUND 1/2 X4 (GAUZE/BANDAGES/DRESSINGS) ×1
CLSR STERI-STRIP ANTIMIC 1/2X4 (GAUZE/BANDAGES/DRESSINGS) ×2 IMPLANT
COLLAR CERV LO CONTOUR FIRM DE (SOFTGOODS) ×3 IMPLANT
CORDS BIPOLAR (ELECTRODE) ×3 IMPLANT
COVER SURGICAL LIGHT HANDLE (MISCELLANEOUS) ×3 IMPLANT
CRADLE DONUT ADULT HEAD (MISCELLANEOUS) ×3 IMPLANT
DEVICE ENDSKLTN TCERV VBR SM 6 (Orthopedic Implant) ×1 IMPLANT
DIFFUSER DRILL AIR PNEUMATIC (MISCELLANEOUS) ×3 IMPLANT
DRAIN JACKSON RD 7FR 3/32 (WOUND CARE) IMPLANT
DRAPE C-ARM 42X72 X-RAY (DRAPES) ×3 IMPLANT
DRAPE POUCH INSTRU U-SHP 10X18 (DRAPES) ×3 IMPLANT
DRAPE SURG 17X23 STRL (DRAPES) ×9 IMPLANT
DRILL BIT SKYLINE 14MM (BIT) ×2
DRILL NEURO 2X3.1 SOFT TOUCH (MISCELLANEOUS) ×3
DURAPREP 26ML APPLICATOR (WOUND CARE) ×3 IMPLANT
ELECT COATED BLADE 2.86 ST (ELECTRODE) ×3 IMPLANT
ELECT REM PT RETURN 9FT ADLT (ELECTROSURGICAL) ×3
ELECTRODE REM PT RTRN 9FT ADLT (ELECTROSURGICAL) ×1 IMPLANT
ENDOSKELETON T CERV VBR SM 6MM (Orthopedic Implant) ×3 IMPLANT
EVACUATOR SILICONE 100CC (DRAIN) IMPLANT
GAUZE SPONGE 4X4 16PLY XRAY LF (GAUZE/BANDAGES/DRESSINGS) ×3 IMPLANT
GLOVE BIO SURGEON STRL SZ7 (GLOVE) ×3 IMPLANT
GLOVE BIO SURGEON STRL SZ8 (GLOVE) ×3 IMPLANT
GLOVE BIOGEL PI IND STRL 7.5 (GLOVE) ×2 IMPLANT
GLOVE BIOGEL PI IND STRL 8 (GLOVE) ×1 IMPLANT
GLOVE BIOGEL PI INDICATOR 7.5 (GLOVE) ×4
GLOVE BIOGEL PI INDICATOR 8 (GLOVE) ×2
GOWN STRL REUS W/ TWL LRG LVL3 (GOWN DISPOSABLE) ×1 IMPLANT
GOWN STRL REUS W/ TWL XL LVL3 (GOWN DISPOSABLE) ×1 IMPLANT
GOWN STRL REUS W/TWL LRG LVL3 (GOWN DISPOSABLE) ×2
GOWN STRL REUS W/TWL XL LVL3 (GOWN DISPOSABLE) ×2
IV CATH 14GX2 1/4 (CATHETERS) ×3 IMPLANT
KIT BASIN OR (CUSTOM PROCEDURE TRAY) ×3 IMPLANT
KIT ROOM TURNOVER OR (KITS) ×3 IMPLANT
MANIFOLD NEPTUNE II (INSTRUMENTS) ×3 IMPLANT
NEEDLE 27GAX1X1/2 (NEEDLE) ×3 IMPLANT
NEEDLE SPNL 20GX3.5 QUINCKE YW (NEEDLE) ×3 IMPLANT
NS IRRIG 1000ML POUR BTL (IV SOLUTION) ×3 IMPLANT
OIL CARTRIDGE MAESTRO DRILL (MISCELLANEOUS) ×3
PACK ORTHO CERVICAL (CUSTOM PROCEDURE TRAY) ×3 IMPLANT
PAD ARMBOARD 7.5X6 YLW CONV (MISCELLANEOUS) ×6 IMPLANT
PATTIES SURGICAL .5 X.5 (GAUZE/BANDAGES/DRESSINGS) IMPLANT
PATTIES SURGICAL .5 X1 (DISPOSABLE) IMPLANT
PIN DISTRACTION 14 (PIN) ×6 IMPLANT
PLATE SKYLINE TWO LEVEL 28MM (Plate) ×3 IMPLANT
PUTTY BONE DBX 2.5 MIS (Bone Implant) ×3 IMPLANT
SCREW VAR SELF TAP SKYLINE 14M (Screw) ×18 IMPLANT
SPONGE GAUZE 4X4 12PLY (GAUZE/BANDAGES/DRESSINGS) ×3 IMPLANT
SPONGE GAUZE 4X4 12PLY STER LF (GAUZE/BANDAGES/DRESSINGS) ×3 IMPLANT
SPONGE INTESTINAL PEANUT (DISPOSABLE) ×3 IMPLANT
SPONGE SURGIFOAM ABS GEL 100 (HEMOSTASIS) ×3 IMPLANT
STRIP CLOSURE SKIN 1/2X4 (GAUZE/BANDAGES/DRESSINGS) ×2 IMPLANT
SURGIFLO TRUKIT (HEMOSTASIS) IMPLANT
SUT MNCRL AB 4-0 PS2 18 (SUTURE) ×3 IMPLANT
SUT SILK 4 0 (SUTURE)
SUT SILK 4-0 18XBRD TIE 12 (SUTURE) IMPLANT
SUT VIC AB 2-0 CT2 18 VCP726D (SUTURE) ×3 IMPLANT
SYR BULB IRRIGATION 50ML (SYRINGE) ×3 IMPLANT
SYR CONTROL 10ML LL (SYRINGE) ×6 IMPLANT
TAPE CLOTH 4X10 WHT NS (GAUZE/BANDAGES/DRESSINGS) ×3 IMPLANT
TAPE UMBILICAL COTTON 1/8X30 (MISCELLANEOUS) ×3 IMPLANT
TOWEL OR 17X24 6PK STRL BLUE (TOWEL DISPOSABLE) ×3 IMPLANT
TOWEL OR 17X26 10 PK STRL BLUE (TOWEL DISPOSABLE) ×3 IMPLANT
WATER STERILE IRR 1000ML POUR (IV SOLUTION) IMPLANT
YANKAUER SUCT BULB TIP NO VENT (SUCTIONS) ×3 IMPLANT

## 2013-11-30 NOTE — OR Nursing (Signed)
Mr. Ian Burton is having 10/10 pain in the posterior neck despite 1.5mg  IV Dilaudid.  Further Dilaudid given.  Dr. Krista BlueSinger notified and Versed and more Dilaudid ordered.

## 2013-11-30 NOTE — Plan of Care (Signed)
Problem: Consults Goal: Diagnosis - Spinal Surgery Outcome: Completed/Met Date Met:  11/30/13 Cervical Spine Fusion

## 2013-11-30 NOTE — Transfer of Care (Signed)
Immediate Anesthesia Transfer of Care Note  Patient: Brynda PeonWilliam T Eiben  Procedure(s) Performed: Procedure(s) with comments: ANTERIOR CERVICAL DECOMPRESSION/DISCECTOMY FUSION 2 LEVELS (N/A) - Anterior cervical decompression fusion, cervical 5-6, cervical 6-7, with instrumentation and allograft  Patient Location: PACU  Anesthesia Type:General  Level of Consciousness: sedated and unresponsive  Airway & Oxygen Therapy: Patient Spontanous Breathing and Patient connected to nasal cannula oxygen  Post-op Assessment: Report given to PACU RN, Post -op Vital signs reviewed and stable and Patient moving all extremities  Post vital signs: Reviewed and stable  Complications: No apparent anesthesia complications

## 2013-11-30 NOTE — Anesthesia Postprocedure Evaluation (Signed)
Anesthesia Post Note  Patient: Ian Burton  Procedure(s) Performed: Procedure(s) (LRB): ANTERIOR CERVICAL DECOMPRESSION/DISCECTOMY FUSION 2 LEVELS (N/A)  Anesthesia type: general  Patient location: PACU  Post pain: Pain level controlled  Post assessment: Patient's Cardiovascular Status Stable  Last Vitals:  Filed Vitals:   11/30/13 1930  BP: 123/81  Pulse: 94  Temp:   Resp: 12    Post vital signs: Reviewed and stable  Level of consciousness: sedated  Complications: No apparent anesthesia complications

## 2013-11-30 NOTE — H&P (Signed)
     PREOPERATIVE H&P  Chief Complaint: left arm pain  HPI: Ian Burton is a 38 y.o. male who presents with ongoing pain in the left arm  MRI reveals stenosis C5-7, correlating to the patient's pain  Patient has failed multiple forms of conservative care and continues to have pain (see office notes for additional details regarding the patient's full course of treatment)  Past Medical History  Diagnosis Date  . Insomnia   . Gallstones     hx of, never removed  . Headache(784.0)     migraines  . GERD (gastroesophageal reflux disease)    Past Surgical History  Procedure Laterality Date  . Appendectomy    . Lumbar laminectomy/decompression microdiscectomy  07/24/2011    Procedure: LUMBAR LAMINECTOMY/DECOMPRESSION MICRODISCECTOMY;  Surgeon: Emilee HeroMark Burton Rosetta Rupnow, MD;  [facility];  Service: Orthopedics;  Laterality: Left;  Left sided lumbar 2-3 microdisectomy   History   Social History  . Marital Status: Legally Separated    Spouse Name: N/A    Number of Children: N/A  . Years of Education: N/A   Social History Main Topics  . Smoking status: Current Every Day Smoker -- 0.50 packs/day for 15 years    Types: Cigarettes  . Smokeless tobacco: Never Used  . Alcohol Use: Yes     Comment: rare  . Drug Use: Yes    Special: Marijuana  . Sexual Activity: Yes   Other Topics Concern  . None   Social History Narrative  . None   History reviewed. No pertinent family history. No Known Allergies Prior to Admission medications   Medication Sig Start Date End Date Taking? Authorizing Provider  HYDROcodone-acetaminophen (NORCO) 10-325 MG per tablet Take 1 tablet by mouth every 4 (four) hours as needed for severe pain.   Yes Historical Provider, MD     All other systems have been reviewed and were otherwise negative with the exception of those mentioned in the HPI and as above.  Physical Exam: There were no vitals filed for this visit.  General: Alert, no acute  distress Cardiovascular: No pedal edema Respiratory: No cyanosis, no use of accessory musculature Skin: No lesions in the area of chief complaint Neurologic: Sensation intact distally Psychiatric: Patient is competent for consent with normal mood and affect Lymphatic: No axillary or cervical lymphadenopathy  MUSCULOSKELETAL: + spurling's sign on the left  Assessment/Plan: Left arm pain Plan for Procedure(s): ANTERIOR CERVICAL DECOMPRESSION/DISCECTOMY FUSION 2 LEVELS   Emilee HeroUMONSKI,Ian Keahey LEONARD, MD 11/30/2013 8:02 AM

## 2013-11-30 NOTE — Anesthesia Preprocedure Evaluation (Signed)
Anesthesia Evaluation  Patient identified by MRN, date of birth, ID band Patient awake    Reviewed: Allergy & Precautions, H&P , NPO status , Patient's Chart, lab work & pertinent test results  Airway Mallampati: II  Neck ROM: full    Dental   Pulmonary Current Smoker,          Cardiovascular negative cardio ROS      Neuro/Psych  Headaches,    GI/Hepatic GERD-  ,  Endo/Other    Renal/GU      Musculoskeletal   Abdominal   Peds  Hematology   Anesthesia Other Findings   Reproductive/Obstetrics                           Anesthesia Physical Anesthesia Plan  ASA: II  Anesthesia Plan: General   Post-op Pain Management:    Induction: Intravenous  Airway Management Planned: Oral ETT  Additional Equipment:   Intra-op Plan:   Post-operative Plan: Extubation in OR  Informed Consent: I have reviewed the patients History and Physical, chart, labs and discussed the procedure including the risks, benefits and alternatives for the proposed anesthesia with the patient or authorized representative who has indicated his/her understanding and acceptance.     Plan Discussed with: CRNA, Anesthesiologist and Surgeon  Anesthesia Plan Comments:         Anesthesia Quick Evaluation

## 2013-11-30 NOTE — Progress Notes (Signed)
Orthopedic Tech Progress Note Patient Details:  Ian PeonWilliam T Burton 02/22/1976 [id number]  Ortho Devices Type of Ortho Device: [place] cervical collar Ortho Device/Splint Location: neck Ortho Device/Splint Interventions: Application   [name] 11/30/2013, 10:01 PM

## 2013-12-01 ENCOUNTER — Encounter (HOSPITAL_COMMUNITY): Payer: Self-pay | Admitting: Orthopedic Surgery

## 2013-12-01 NOTE — Progress Notes (Signed)
    Patient doing well Had a difficult night, and has various concerns about his overnight care  Currently c/o expected postop posterior neck pain Left arm pain resolved   Physical Exam: Filed Vitals:   12/01/13 0400  BP: 112/74  Pulse: 94  Temp: 99.4 F (37.4 C)  Resp: 20    Dressing in place NVI Collar well-applied Neck soft/supple  POD #1 s/p C5-7 ACDF with resolved arm pain and expected posterior paraspinal pain  - encourage ambulation - Percocet for pain, Valium for muscle spasms - likely d/c home today, f/u 2 weeks

## 2013-12-01 NOTE — Progress Notes (Signed)
Patient alert and oriented, mae's well, voiding adequate amount of urine, swallowing without difficulty, no c/o pain. Patient discharged home with family. Script and discharged instructions given to patient. Patient and family stated understanding of instructions given.  

## 2013-12-01 NOTE — Op Note (Signed)
NAME:  Ian Burton, Ian Burton NO.:  [id number]  MEDICAL RECORD NO.:  [id number]  LOCATION:  3C09C                        FACILITY:  [facility]  PHYSICIAN:  Estill Bamberg, MD      DATE OF BIRTH:  1975-11-13  DATE OF PROCEDURE:  11/30/2013                              OPERATIVE REPORT   PREOPERATIVE DIAGNOSES: 1. Severe left-sided cervical radiculopathy. 2. 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  POSTOPERATIVE DIAGNOSES: 1. Severe left-sided cervical radiculopathy. 2. 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  PROCEDURES: 1. Anterior cervical decompression and fusion C5-6, C6-7. 2. Placement of anterior instrumentation, C5-C7. 3. Use of morselized allograft-DBX mix. 4. Insertion of interbody device x2 (tightened interbody spacers). 5. Intraoperative use of fluoroscopy.  SURGEON:  Estill Bamberg, MD.  ASSISTANTJason Coop, PA-C.  ANESTHESIA:  General endotracheal anesthesia.  COMPLICATIONS:  None.  DISPOSITION:  Stable.  ESTIMATED BLOOD LOSS:  Minimal.  INDICATIONS FOR PROCEDURE:  Briefly, Ian Burton is a very pleasant 54- year-old male who I did previously evaluate for right-sided cervical radiculopathy.  These particular symptoms did improve, however, the patient did go on to have rather severe pain in his left arm.  An updated MRI did reveal prominent stenosis on the left side at C5-6 and C6-7.  This did correlate to the patient's symptoms.  Given his ongoing and debilitating pain, we did discuss proceeding with the procedure reflected above.  The patient did elect to proceed.  OPERATIVE DETAILS:  On November 30, 2013, the patient was brought to Surgery and general endotracheal anesthesia was administered.  The patient was placed supine on the hospital bed.  Antibiotics were given.  The neck was prepped in the usual fashion and a time-out procedure was performed. All bony prominences were padded.  A left-sided transverse incision was then  made centered over the C6 vertebral body.  The Smith-Robinson approach was used and the anterior cervical spine was noted.  A lateral fluoroscopic image did confirm the appropriate operative level.  I then subperiosteally exposed the vertebral bodies of C5, C6, and C7.  I then placed a self-retaining retractor centered over the C6-7 space.  Caspar pins were placed and distraction was applied.  I then went forward with a standard diskectomy.  The posterior longitudinal ligament was entered using a nerve hook, and then I was able to perform a thorough central one left-sided neural foraminal decompression, as well as a right-sided neural foraminal decompression.  The adequacy of the decompression was confirmed using a nerve hook.  The endplates were then prepared and the appropriate size interbody spacer was packed with DBX mix and tamped into position.  I was pleased with the press fit of the implant.  The lower Caspar pin was removed and bone wax was placed in its place.  I then performed a standard C5-6 diskectomy as previously described. Again, a thorough decompression was confirmed using a nerve hook.  The appropriate-sized interbody spacer was packed with DBX mix and tamped into position in the usual fashion.  The Caspar pins were removed and bone wax was placed in their place.  The appropriate-sized anterior cervical plate was placed over the anterior spine.  A 14-mm variable  angle screws were placed, 2 in each vertebral body from C5-C7 for a total of 6 screws.  Again, locking mechanism was then used.  I then copiously irrigated the wound.  I explored the wound for bleeding, and there are minor areas of bleeding which was controlled using bipolar electrocautery.  The wound was then copiously irrigated again.  The platysma was then closed using 2-0 Vicryl.  The skin was then closed using 3-0 Monocryl.  Benzoin and Steri-Strips were applied followed by a sterile dressing.  All instrument  counts were correct at the termination of the procedure.  Of note, Jason CoopKayla McKenzie was my assistant throughout surgery, and did aid in retraction and suctioning and closure.     Estill BambergMark Edras Wilford, MD     MD/MEDQ  D:  11/30/2013  T:  11/30/2013  Job:  [id number]

## 2013-12-02 NOTE — Addendum Note (Signed)
Addendum created 12/02/13 1851 by Arta BruceKevin Jermarcus Mcfadyen, MD   Modules edited: Anesthesia Events

## 2013-12-07 NOTE — Discharge Summary (Signed)
Patient ID: Ian Burton MRN: [id number] DOB/AGE: 01-12-1976 38 y.o.  Admit date: 11/30/2013 Discharge date: 12/01/2013  Admission Diagnoses:  Active Problems:   Radiculopathy   Discharge Diagnoses:  Same  Past Medical History  Diagnosis Date  . Insomnia   . Gallstones     hx of, never removed  . Headache(784.0)     migraines  . GERD (gastroesophageal reflux disease)     Surgeries: Procedure(s): ANTERIOR CERVICAL DECOMPRESSION/DISCECTOMY FUSION 2 LEVELS C5-7 on 11/30/2013   Consultants:    Discharged Condition: Improved  Hospital Course: Ian Burton is an 38 y.o. male who was admitted 11/30/2013 for operative treatment of radiculopathy. Patient has severe unremitting pain that affects sleep, daily activities, and work/hobbies. After pre-op clearance the patient was taken to the operating room on 11/30/2013 and underwent  Procedure(s): ANTERIOR CERVICAL DECOMPRESSION/DISCECTOMY FUSION 2 LEVELS C5-7.    Patient was given perioperative antibiotics:  Anti-infectives   Start     Dose/Rate Route Frequency Ordered Stop   11/30/13 2300  ceFAZolin (ANCEF) IVPB 1 g/50 mL premix     1 g 100 mL/hr over 30 Minutes Intravenous Every 8 hours 11/30/13 2118 12/01/13 0636   11/30/13 0600  ceFAZolin (ANCEF) IVPB 2 g/50 mL premix     2 g 100 mL/hr over 30 Minutes Intravenous On call to O.R. 11/29/13 1409 11/30/13 1525       Patient was given sequential compression devices, early ambulation to prevent DVT.  Patient benefited maximally from hospital stay and there were no complications.    Recent vital signs: BP 136/85  Pulse 87  Temp(Src) 98.6 F (37 C) (Oral)  Resp 18  Ht 5\' 7"  (1.702 m)  Wt 68.04 kg (150 lb)  BMI 23.49 kg/m2  SpO2 98%   Discharge Medications:     Medication List    STOP taking these medications       HYDROcodone-acetaminophen 10-325 MG per tablet  Commonly known as:  NORCO        Diagnostic Studies: Dg Chest 2 View  11/30/2013   CLINICAL  DATA:  Preoperative exam.  Smoker.  EXAM: CHEST  2 VIEW  COMPARISON:  And lateral chest 07/02/2011.  FINDINGS: Heart size and mediastinal contours are within normal limits. Both lungs are clear. Visualized skeletal structures are unremarkable.  IMPRESSION: Negative exam.   Electronically Signed   By: Drusilla Kanner M.D.   On: 11/30/2013 13:46   Dg Cervical Spine 1 View  11/30/2013   CLINICAL DATA:  Cervical fusion.  ACDF C5-6 and C6-7  EXAM: DG CERVICAL SPINE - 1 VIEW  COMPARISON:  MRI from 01/16/2012  FINDINGS: Single cross-table lateral view of the lower cervical spine obtained intraoperatively 1728 hrs. This shows the patient these status post anterior cervical discectomy at C5-6 and C6-7 with anterior plate extending from the C5 level down to C7. The distal extent of the plate is not well visualized. Screws are seen in the C5, C6, and C7 vertebral bodies. Surgical sponge is noted within the operative bed. Esophageal and tracheal tubes are evident.  IMPRESSION: Intraoperative localization.   Electronically Signed   By: Kennith Center M.D.   On: 11/30/2013 17:42    Disposition: 01-Home or Self Care   POD #1 s/p C5-7 ACDF with resolved arm pain and expected posterior paraspinal pain  -Written scripts for pain signed and in chart -D/C instructions sheet printed and in chart -D/C today  -F/U in office 2 weeks   Signed: Jason Coop  J 12/07/2013, 12:04 PM

## 2016-01-16 IMAGING — RF DG C-ARM 61-120 MIN
1 series · 1 of 1 positions shown · non-contrast
Comparison: MRI from 01/16/2012

CLINICAL DATA: Cervical fusion.  ACDF C5-6 and C6-7

EXAM:
DG CERVICAL SPINE - 1 VIEW

[Series 1: run · 1 of 1 slices shown]
[im 1/1]
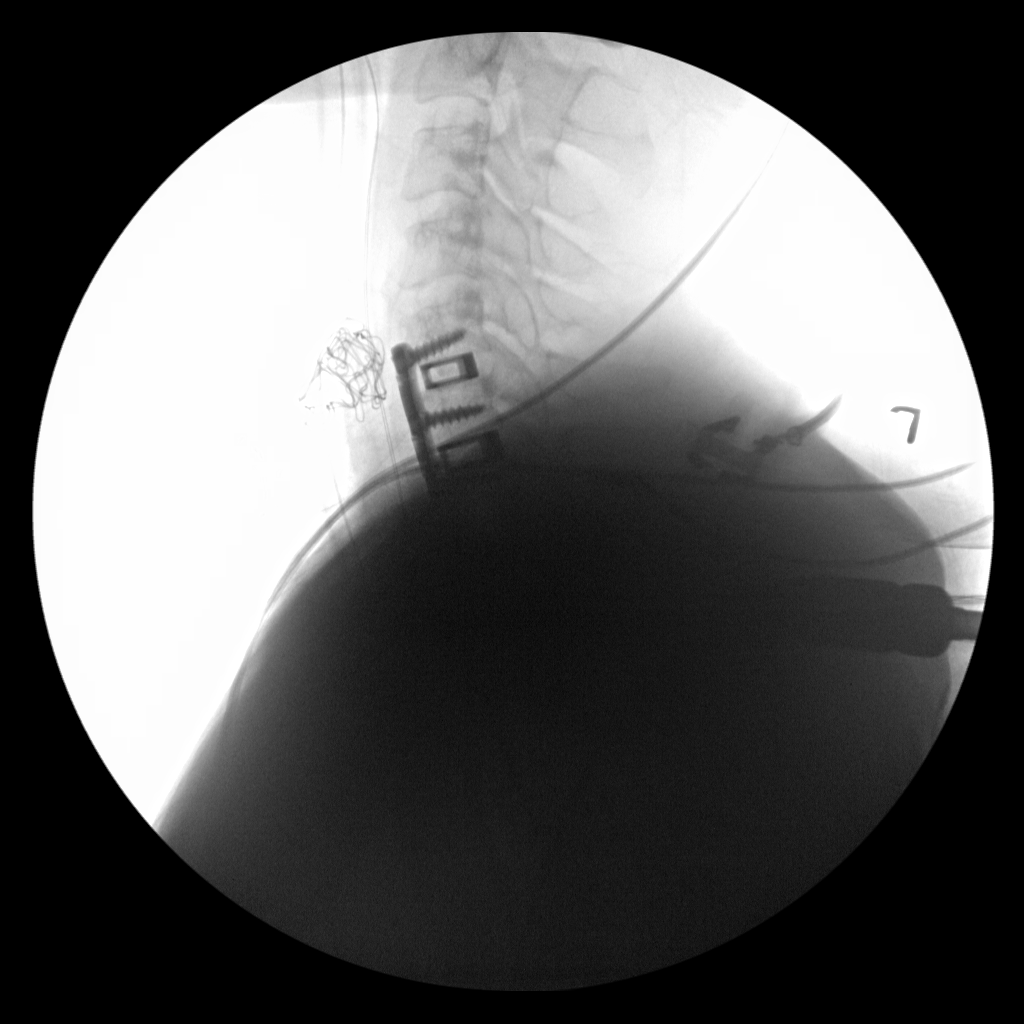

[1 of 1 positions shown; findings below may reference images not displayed]

FINDINGS: Single cross-table lateral view of the lower cervical spine obtained
intraoperatively 2125 hrs. This shows the patient these status post
anterior cervical discectomy at C5-6 and C6-7 with anterior plate
extending from the C5 level down to C7. The distal extent of the
plate is not well visualized. Screws are seen in the C5, C6, and C7
vertebral bodies. Surgical sponge is noted within the operative bed.
Esophageal and tracheal tubes are evident.
IMPRESSION: Intraoperative localization.
